# Patient Record
Sex: Female | Born: 1980 | Hispanic: Yes | Marital: Single | Smoking: Never smoker
Health system: Southern US, Community
[De-identification: ages and names within clinical notes are randomized; demographics above are authoritative.]

## PROBLEM LIST (undated history)

## (undated) HISTORY — PX: ABDOMINAL HYSTERECTOMY: SHX81

---

## 2018-11-16 ENCOUNTER — Other Ambulatory Visit: Payer: Self-pay

## 2018-11-16 ENCOUNTER — Encounter (HOSPITAL_COMMUNITY): Payer: Self-pay

## 2018-11-16 ENCOUNTER — Emergency Department (HOSPITAL_COMMUNITY): Payer: Self-pay

## 2018-11-16 ENCOUNTER — Emergency Department (HOSPITAL_COMMUNITY)
Admission: EM | Admit: 2018-11-16 | Discharge: 2018-11-16 | Disposition: A | Discharge status: Home / Self Care | Payer: Self-pay | Attending: Emergency Medicine | Admitting: Emergency Medicine

## 2018-11-16 DIAGNOSIS — N3001 Acute cystitis with hematuria: Secondary | ICD-10-CM | POA: Insufficient documentation

## 2018-11-16 LAB — URINALYSIS, ROUTINE W REFLEX MICROSCOPIC
Bilirubin Urine: NEGATIVE
Glucose, UA: NEGATIVE mg/dL
Ketones, ur: NEGATIVE mg/dL
Nitrite: NEGATIVE
Protein, ur: NEGATIVE mg/dL
Specific Gravity, Urine: 1.011 (ref 1.005–1.030)
WBC, UA: >50 WBC/hpf — ABNORMAL HIGH (ref 0–5)
pH: 7 (ref 5.0–8.0)

## 2018-11-16 LAB — CBC WITH DIFFERENTIAL/PLATELET
Abs Immature Granulocytes: 0.11 10*3/uL — ABNORMAL HIGH (ref 0.00–0.07)
Basophils Absolute: 0.1 10*3/uL (ref 0.0–0.1)
Basophils Relative: 0 %
Eosinophils Absolute: 0.1 10*3/uL (ref 0.0–0.5)
Eosinophils Relative: 1 %
HCT: 39.5 % (ref 36.0–46.0)
Hemoglobin: 12.5 g/dL (ref 12.0–15.0)
Immature Granulocytes: 1 %
Lymphocytes Relative: 11 %
Lymphs Abs: 2.2 10*3/uL (ref 0.7–4.0)
MCH: 28.2 pg (ref 26.0–34.0)
MCHC: 31.6 g/dL (ref 30.0–36.0)
MCV: 89 fL (ref 80.0–100.0)
Monocytes Absolute: 1.4 10*3/uL — ABNORMAL HIGH (ref 0.1–1.0)
Monocytes Relative: 7 %
Neutro Abs: 16.8 10*3/uL — ABNORMAL HIGH (ref 1.7–7.7)
Neutrophils Relative %: 80 %
Platelets: 413 10*3/uL — ABNORMAL HIGH (ref 150–400)
RBC: 4.44 MIL/uL (ref 3.87–5.11)
RDW: 13.7 % (ref 11.5–15.5)
WBC: 20.6 10*3/uL — ABNORMAL HIGH (ref 4.0–10.5)
nRBC: 0 % (ref 0.0–0.2)

## 2018-11-16 LAB — I-STAT BETA HCG BLOOD, ED (MC, WL, AP ONLY): I-stat hCG, quantitative: <5 m[IU]/mL (ref <5)

## 2018-11-16 LAB — COMPREHENSIVE METABOLIC PANEL
ALT: 23 U/L (ref 0–44)
AST: 25 U/L (ref 15–41)
Albumin: 4.4 g/dL (ref 3.5–5.0)
Alkaline Phosphatase: 65 U/L (ref 38–126)
Anion gap: 9 (ref 5–15)
BUN: 15 mg/dL (ref 6–20)
CO2: 26 mmol/L (ref 22–32)
Calcium: 9 mg/dL (ref 8.9–10.3)
Chloride: 102 mmol/L (ref 98–111)
Creatinine, Ser: 0.67 mg/dL (ref 0.44–1.00)
GFR calc Af Amer: >60 mL/min (ref >60)
GFR calc non Af Amer: >60 mL/min (ref >60)
Glucose, Bld: 103 mg/dL — ABNORMAL HIGH (ref 70–99)
Potassium: 4.1 mmol/L (ref 3.5–5.1)
Sodium: 137 mmol/L (ref 135–145)
Total Bilirubin: 0.2 mg/dL — ABNORMAL LOW (ref 0.3–1.2)
Total Protein: 7.7 g/dL (ref 6.5–8.1)

## 2018-11-16 LAB — LIPASE, BLOOD: Lipase: 28 U/L (ref 11–51)

## 2018-11-16 MED ORDER — SODIUM CHLORIDE 0.9 % IV BOLUS
1000.0000 mL | Freq: Once | INTRAVENOUS | Status: AC
  Administered 2018-11-16: 18:00:00 1000 mL via INTRAVENOUS

## 2018-11-16 MED ORDER — SODIUM CHLORIDE (PF) 0.9 % IJ SOLN
INTRAMUSCULAR | Status: AC
  Filled 2018-11-16: qty 50

## 2018-11-16 MED ORDER — MORPHINE SULFATE (PF) 4 MG/ML IV SOLN
4.0000 mg | Freq: Once | INTRAVENOUS | Status: DC
  Filled 2018-11-16: qty 1

## 2018-11-16 MED ORDER — IOHEXOL 300 MG/ML  SOLN
100.0000 mL | Freq: Once | INTRAMUSCULAR | Status: AC | PRN
  Administered 2018-11-16: 100 mL via INTRAVENOUS

## 2018-11-16 MED ORDER — SODIUM CHLORIDE 0.9 % IV SOLN
1.0000 g | Freq: Once | INTRAVENOUS | Status: DC
  Filled 2018-11-16: qty 10

## 2018-11-16 MED ORDER — CEPHALEXIN 500 MG PO CAPS: 500.0000 mg | ORAL_CAPSULE | Freq: Four times a day (QID) | ORAL | 0 refills | Status: AC

## 2018-11-16 MED ORDER — MORPHINE SULFATE (PF) 4 MG/ML IV SOLN
4.0000 mg | Freq: Once | INTRAVENOUS | Status: AC
  Administered 2018-11-16: 18:00:00 4 mg via INTRAVENOUS
  Filled 2018-11-16: qty 1

## 2018-11-16 MED ORDER — ONDANSETRON 4 MG PO TBDP: 4.0000 mg | ORAL_TABLET | Freq: Three times a day (TID) | ORAL | 0 refills | Status: AC | PRN

## 2018-11-16 MED ORDER — CEFTRIAXONE SODIUM 1 G IJ SOLR
1.0000 g | Freq: Once | INTRAMUSCULAR | Status: AC
  Administered 2018-11-16: 21:00:00 1 g via INTRAMUSCULAR
  Filled 2018-11-16: qty 10

## 2018-11-16 MED ORDER — ONDANSETRON HCL 4 MG/2ML IJ SOLN
4.0000 mg | Freq: Once | INTRAMUSCULAR | Status: AC
  Administered 2018-11-16: 4 mg via INTRAVENOUS
  Filled 2018-11-16: qty 2

## 2018-11-16 NOTE — ED Triage Notes (Signed)
Patient c/o lower abdominal pain and right flank pain x 2 days. Patient reports that she has dysuria, frequency and visible blood in her urine.

## 2018-11-16 NOTE — ED Notes (Signed)
The rocephin 1g in NA CL 0.9% 100 mL IVPB was discontinued while I was in the room and already activated the medication. Wasted in Chiropodist bin with Press photographer.

## 2018-11-16 NOTE — Discharge Instructions (Addendum)
You were evaluated today for urinary tract infection.  Take antibiotics as prescribed.  Follow-up with PCP for reevaluation.  If you develop fever, worsening pain seek reevaluation.

## 2018-11-16 NOTE — ED Notes (Signed)
Pt requesting prescriptions be sent to the [facility] on [facility]

## 2018-11-16 NOTE — ED Notes (Signed)
Patient transported to CT 

## 2018-11-16 NOTE — ED Notes (Signed)
Patient  Is spanish speaking and Video interpreter Achilles Dunk is

## 2018-11-16 NOTE — ED Provider Notes (Signed)
[facility]-EMERGENCY DEPT Provider Note   CSN: [id number] Arrival date & time: 11/16/18  1605  History   Chief Complaint Chief Complaint  Patient presents with   Abdominal Pain   Flank Pain   Urinary Frequency   Hematuria    HPI Charlotte Sanders is a 38 y.o. female with no past medical history who presents for evaluation of abdominal pain.  Patient states she has had lower abdominal pain x2 days.  Patient states she woke up this morning and her pain radiated to her right flank.  States she is also had dysuria, increased frequency.  States just PTA when she went to use the restroom when she wiped she noticed some blood on the toilet tissue.  Denies history of cystitis or kidney stones.  States she does have a prior history of laparoscopic abdominal surgery however does not know what this was for.  States she no longer gets her menstrual cycle.  Her pain is not worse with food intake.  She denies fever, chills, vomiting, headache, dizziness, syncope, chest pain, shortness of breath, diarrhea dysuria.  Her last bowel movement was this morning without melena or hematochezia.  States she has had persistent nausea since around 12 PM today.  She has been able to tolerate small sips of juice without issue.   Denies any medication allergies.  She does have history of hypercholesterolemia which is controlled by diet.  She does not have a PCP. Last p.o. intake 12 PM today.  Prior abdominal surgeries??  History obtained from patient and review of prior medical record.  Medical Spanish interpreter was used.     HPI  History reviewed. No pertinent past medical history.  There are no active problems to display for this patient.   Past Surgical History:  Procedure Laterality Date   ABDOMINAL HYSTERECTOMY       OB History   No obstetric history on file.      Home Medications    Prior to Admission medications   Medication Sig Start Date End Date Taking?  Authorizing Provider  ibuprofen (ADVIL) 200 MG tablet Take 400 mg by mouth every 6 (six) hours as needed for mild pain.   Yes [provider]  cephALEXin (KEFLEX) 500 MG capsule Take 1 capsule (500 mg total) by mouth 4 (four) times daily for 5 days. 11/16/18 11/21/18  Daleen Steinhaus A, PA-C  ondansetron (ZOFRAN ODT) 4 MG disintegrating tablet Take 1 tablet (4 mg total) by mouth every 8 (eight) hours as needed for nausea or vomiting. 11/16/18   Amberlin Utke A, PA-C    Family History Family History  Problem Relation Age of Onset   Hypertension Mother    Diabetes Mother    Cancer Father     Social History Social History   Tobacco Use   Smoking status: Never Smoker   Smokeless tobacco: Never Used  Substance Use Topics   Alcohol use: Yes   Drug use: Never     Allergies   Patient has no known allergies.   Review of Systems Review of Systems  Constitutional: Negative.   HENT: Negative.   Eyes: Negative.   Respiratory: Negative.   Cardiovascular: Negative.   Gastrointestinal: Positive for abdominal pain and nausea. Negative for abdominal distention, anal bleeding, blood in stool, constipation, diarrhea, rectal pain and vomiting.  Genitourinary: Positive for dysuria, flank pain, frequency and hematuria. Negative for decreased urine volume, difficulty urinating, genital sores, menstrual problem, pelvic pain, urgency, vaginal bleeding, vaginal discharge and vaginal  pain.  Skin: Negative.   Neurological: Negative.   All other systems reviewed and are negative.    Physical Exam Updated Vital Signs BP (!) 101/57    Pulse 82    Temp 98.3 F (36.8 C) (Oral)    Resp 15    Ht 5\' 5"  (1.651 m)    Wt 60.8 kg    LMP 11/02/2018 Comment: neg hcg   SpO2 98%    BMI 22.30 kg/m   Physical Exam Vitals signs and nursing note reviewed.  Constitutional:      General: She is not in acute distress.    Appearance: She is well-developed. She is not ill-appearing, toxic-appearing  or diaphoretic.  HENT:     Head: Normocephalic and atraumatic.     Mouth/Throat:     Mouth: Mucous membranes are moist.     Pharynx: Oropharynx is clear.     Comments: Mucous membranes moist. Posterior oropharynx clear. Uvula midline. Eyes:     Pupils: Pupils are equal, round, and reactive to light.  Neck:     Musculoskeletal: Normal range of motion.  Cardiovascular:     Rate and Rhythm: Normal rate.     Heart sounds: Normal heart sounds. No murmur. No friction rub. No gallop.   Pulmonary:     Effort: No respiratory distress.     Comments: Clear to auscultation bilaterally without wheeze, rhonchi or rales.  Speaks in full sentences no difficulty. No assessory muscle usage. Abdominal:     General: Bowel sounds are normal. There is no distension.     Palpations: Abdomen is soft.     Tenderness: There is no right CVA tenderness, left CVA tenderness, guarding or rebound.     Hernia: No hernia is present.     Comments: Soft without rebound or guarding.  Tenderness to palpation to suprapubic region.  Negative CVA tap bilaterally.  Negative left lower quadrant or right lower quadrant focal tenderness.  Negative psoas obturator sign.  Negative McBurney point.  Musculoskeletal: Normal range of motion.     Comments: Moves all 4 extremities without difficulty.  No lower extremity with, erythema, ecchymosis or warmth.  Skin:    General: Skin is warm and dry.     Comments: Brisk capillary refill.  No pallor.  No rashes or lesions.  Neurological:     Mental Status: She is alert.     Comments: Cranial nerves III to XII grossly intact.  No facial droop.  Ambulatory in ED without difficulty.      ED Treatments / Results  Labs (all labs ordered are listed, but only abnormal results are displayed) Labs Reviewed  URINALYSIS, ROUTINE W REFLEX MICROSCOPIC - Abnormal; Notable for the following components:      Result Value   Color, Urine STRAW (*)    APPearance HAZY (*)    Hgb urine dipstick  MODERATE (*)    Leukocytes,Ua LARGE (*)    WBC, UA >50 (*)    Bacteria, UA RARE (*)    All other components within normal limits  CBC WITH DIFFERENTIAL/PLATELET - Abnormal; Notable for the following components:   WBC 20.6 (*)    Platelets 413 (*)    Neutro Abs 16.8 (*)    Monocytes Absolute 1.4 (*)    Abs Immature Granulocytes 0.11 (*)    All other components within normal limits  COMPREHENSIVE METABOLIC PANEL - Abnormal; Notable for the following components:   Glucose, Bld 103 (*)    Total Bilirubin 0.2 (*)  All other components within normal limits  URINE CULTURE  LIPASE, BLOOD  I-STAT BETA HCG BLOOD, ED ([facility], [facility], [facility] ONLY)    EKG None  Radiology Ct Abdomen Pelvis W Contrast  Result Date: 11/16/2018 CLINICAL DATA:  Lower abdominal and right flank pain for 2 days. Dysuria and frequency with hematuria. EXAM: CT ABDOMEN AND PELVIS WITH CONTRAST TECHNIQUE: Multidetector CT imaging of the abdomen and pelvis was performed using the standard protocol following bolus administration of intravenous contrast. CONTRAST:  100mL OMNIPAQUE IOHEXOL 300 MG/ML  SOLN COMPARISON:  None. FINDINGS: Lower chest:  No contributory findings. Hepatobiliary: No focal liver abnormality.No evidence of biliary obstruction or stone. Pancreas: Unremarkable. Spleen: Unremarkable. Adrenals/Urinary Tract: Negative adrenals. 2 mm right renal calculus. No hydronephrosis or ureteral stone. No detectable pyelonephritis. Unremarkable bladder. Stomach/Bowel: No obstruction. Small volume fluid present about the tip of the appendix which is not thickened or otherwise inflamed appearing. Vascular/Lymphatic: No acute vascular abnormality. No mass or adenopathy. Reproductive:No pathologic findings. Other: Trace peritoneal fluid which may be physiologic. Musculoskeletal: No acute abnormalities. IMPRESSION: 1. History of pyelonephritis which is not detectable by CT. No hydronephrosis or abscess. 2. 2 mm right renal calculus.  Electronically Signed   By: Marnee SpringJonathon  Watts M.D.   On: 11/16/2018 19:04    Procedures Procedures (including critical care time)  Medications Ordered in ED Medications  sodium chloride (PF) 0.9 % injection (0 mLs  Hold 11/16/18 1845)  morphine 4 MG/ML injection 4 mg (4 mg Intravenous Refused 11/16/18 2058)  sodium chloride 0.9 % bolus 1,000 mL (0 mLs Intravenous Stopped 11/16/18 1845)  ondansetron (ZOFRAN) injection 4 mg (4 mg Intravenous Given 11/16/18 1731)  morphine 4 MG/ML injection 4 mg (4 mg Intravenous Given 11/16/18 1732)  iohexol (OMNIPAQUE) 300 MG/ML solution 100 mL (100 mLs Intravenous Contrast Given 11/16/18 1848)  cefTRIAXone (ROCEPHIN) injection 1 g (1 g Intramuscular Given 11/16/18 2046)    Initial Impression / Assessment and Plan / ED Course  I have reviewed the triage vital signs and the nursing notes.  Pertinent labs & imaging results that were available during my care of the patient were reviewed by me and considered in my medical decision making (see chart for details).  38 year old female appears otherwise well presents for evaluation of abdominal pain.  Afebrile, nonseptic, non-ill-appearing.  Patient with urinary frequency, dysuria as well as right-sided flank pain.  She denies any history of UTI or known stone disease.  She is able to tolerate p.o. intake at home.  Her abdomen is soft without rebound or guarding.  She has mild suprapubic tenderness palpation.  She has no focal right lower quadrant tenderness palpation.  Negative CVA tap bilaterally.  Negative psoas obturator sign.  States she does have prior abdominal surgical history however she cannot recall which she had previously done.  [place]  I do not see records of this in epic.  No abdominal wall skin changes.  No obvious herniations.  Mucous membranes moist.  Heart and lungs clear.  Last bowel movement this morning without melena or hematochezia.  No known COVID exposures.  Will plan for  labs, urine, CT imaging to assess for pyelonephritis, stone, abscess and reevaluate.  1830: Pain well controlled. Pending CT AP.  Labs and imaging personally reviewed: CBC: With leukocytosis at 20.6, hemoglobin at 12.5 CMP: without electrolyte, renal or liver abnormalities Lipase: 28 Hcg: Negative Urinalysis: Consistent with infection Urine culture: Pending CT AP: 2mm renal stone. No ureteral stone or pyelonephritis.  No acute AP pathology. Low suspicion for stone as cause of pain as no ureteral stone or infected stone.  On reevaluation patient comfortable. Patient given Rocephin in ED.  Pain controlled.  She is no episodes of emesis. Question early pyelonephritis given positive urinalysis with flank pain? VS stable without tachycardia, tachypnea or hypoxia.  Tolerating p.o. intake in ED without difficulty.  She has not any melena, hematochezia or hematemesis.  No evidence of GI bleed.  Patient is nontoxic, nonseptic appearing, in no apparent distress.  Patient's pain and other symptoms adequately managed in emergency department.  Fluid bolus given.  Labs, imaging and vitals reviewed.  Patient does not meet the SIRS or Sepsis criteria.  On repeat exam patient does not have a surgical abdomin and there are no peritoneal signs.  No indication of appendicitis, bowel obstruction, bowel perforation, cholecystitis, diverticulitis, PID or ectopic pregnancy, mesenteric ischemia, ovarian torsion, strangulated hernia.  The patient has been appropriately medically screened and/or stabilized in the ED. I have low suspicion for any other emergent medical condition which would require further screening, evaluation or treatment in the ED or require inpatient management.  Patient is hemodynamically stable and in no acute distress.  Patient able to ambulate in department prior to ED.  Evaluation does not show acute pathology that would require ongoing or additional emergent interventions while in the emergency  department or further inpatient treatment.  I have discussed the diagnosis with the patient and answered all questions.  Pain is been managed while in the emergency department and patient has no further complaints prior to discharge. I have discussed strict return precautions for returning to the emergency department.  Patient was encouraged to follow-up with PCP/specialist refer to at discharge.      Final Clinical Impressions(s) / ED Diagnoses   Final diagnoses:  Acute cystitis with hematuria    ED Discharge Orders         Ordered    cephALEXin (KEFLEX) 500 MG capsule  4 times daily     11/16/18 2000    ondansetron (ZOFRAN ODT) 4 MG disintegrating tablet  Every 8 hours PRN     11/16/18 2000           Catricia Scheerer A, PA-C 11/16/18 2137    Melene Plan, DO 11/16/18 2217

## 2018-11-18 LAB — URINE CULTURE: Culture: >=100000 — AB

## 2018-11-19 ENCOUNTER — Telehealth: Payer: Self-pay | Admitting: *Deleted

## 2018-11-19 NOTE — Telephone Encounter (Signed)
Post ED Visit - Positive Culture Follow-up  Culture report reviewed by antimicrobial stewardship pharmacist: [facility] []  Enzo Bi, Pharm.D. []  Celedonio Miyamoto, Pharm.D., BCPS AQ-ID []  Garvin Fila, Pharm.D., BCPS []  Georgina Pillion, [address].D., BCPS []  Bud, [address].D., BCPS, AAHIVP []  Estella Husk, Pharm.D., BCPS, AAHIVP []  Lysle Pearl, PharmD, BCPS []  Phillips Climes, PharmD, BCPS []  Agapito Games, PharmD, BCPS []  Verlan Friends, PharmD []  Mervyn Gay, PharmD, BCPS []  Vinnie Level, PharmD  [facility] []  Len Childs, PharmD []  Greer Pickerel, PharmD []  Adalberto Cole, PharmD []  Perlie Gold, Rph []  Lonell Face) Jean Rosenthal, PharmD []  Earl Many, PharmD []  Junita Push, PharmD []  Dorna Leitz, PharmD []  Terrilee Files, PharmD []  Lynann Beaver, PharmD [x]  Keturah Barre, PharmD []  Loralee Pacas, PharmD []  Bernadene Person, PharmD   Positive urine culture Treated with Cephalexin, organism sensitive to the same and no further patient follow-up is required at this time.  [facility] 11/19/2018, 11:03 AM

## 2020-06-11 IMAGING — CT CT ABDOMEN AND PELVIS WITH CONTRAST
2 of 4 series · 16 of 46 positions shown, 18 images · IV contrast (omnipaque)
Comparison: None.

CLINICAL DATA: Lower abdominal and right flank pain for 2 days.
Dysuria and frequency with hematuria.

EXAM:
CT ABDOMEN AND PELVIS WITH CONTRAST
TECHNIQUE: Multidetector CT imaging of the abdomen and pelvis was performed
using the standard protocol following bolus administration of
intravenous contrast.
CONTRAST:  100mL OMNIPAQUE IOHEXOL 300 MG/ML  SOLN

[Series 2: axial st · axial · 0.68mm/px · z∈[-438,-18]mm · 13 of 94 slices shown, 15 images]
[im 5/94  soft-tissue]
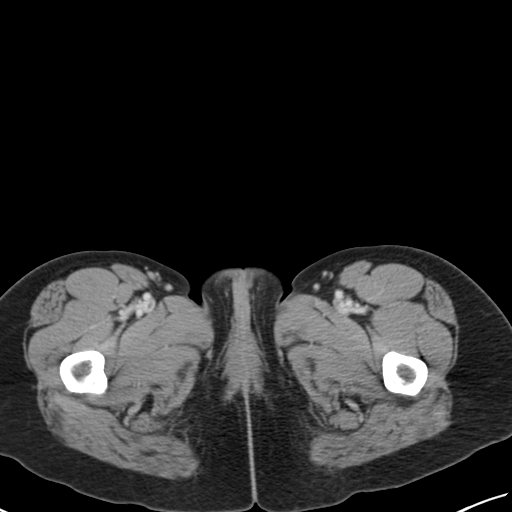
[im 5/94  bone]
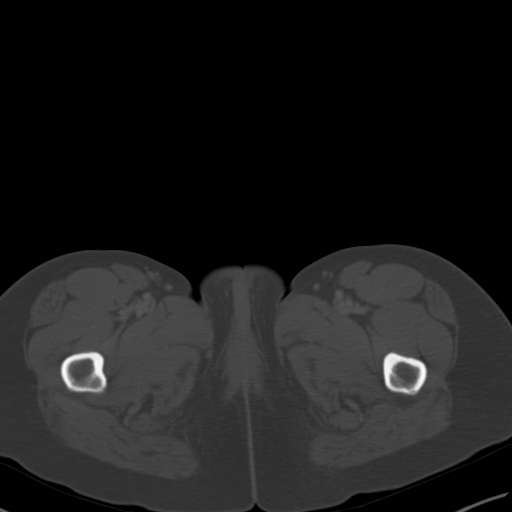
[im 14/94  soft-tissue]
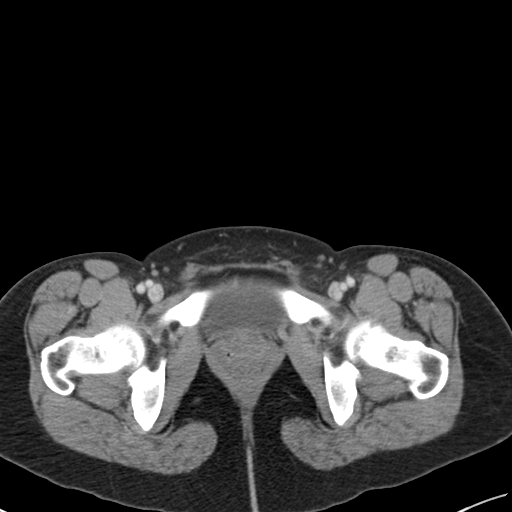
[im 19/94  soft-tissue]
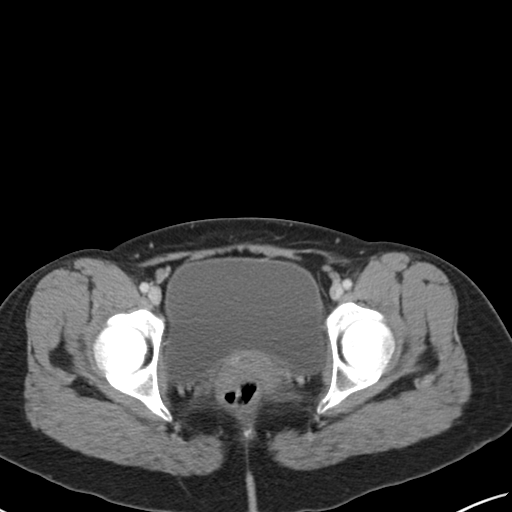
[im 28/94  soft-tissue]
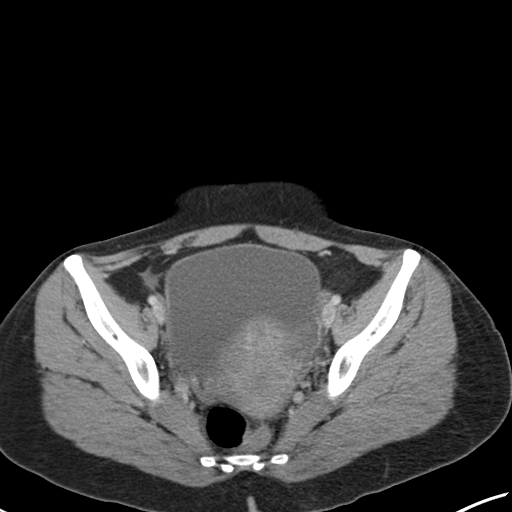
[im 33/94  soft-tissue]
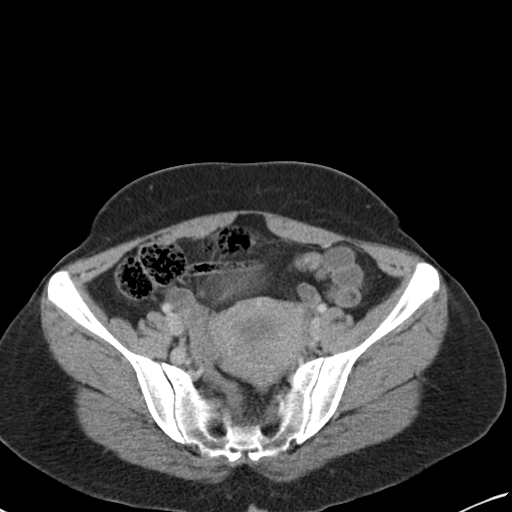
[im 42/94  soft-tissue]
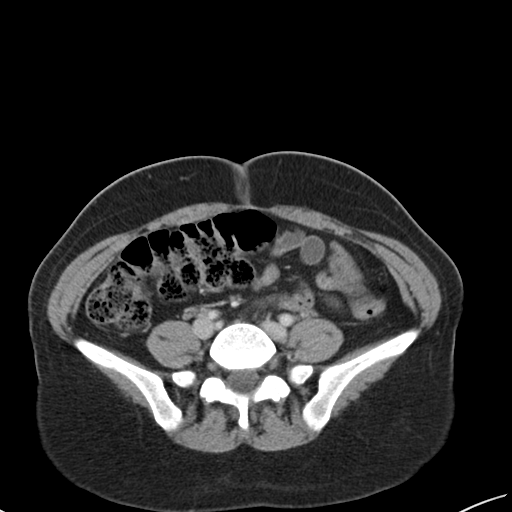
[im 47/94  soft-tissue]
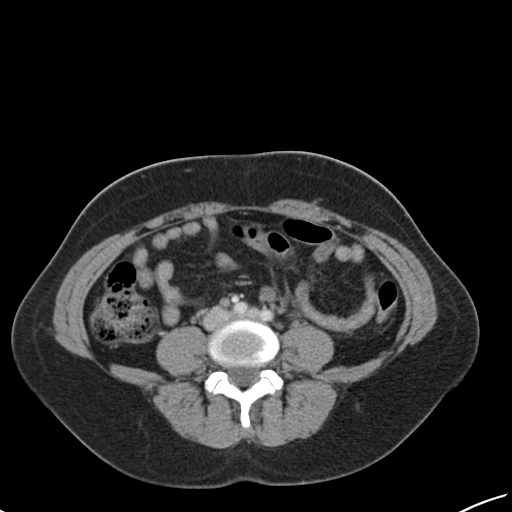
[im 52/94  soft-tissue]
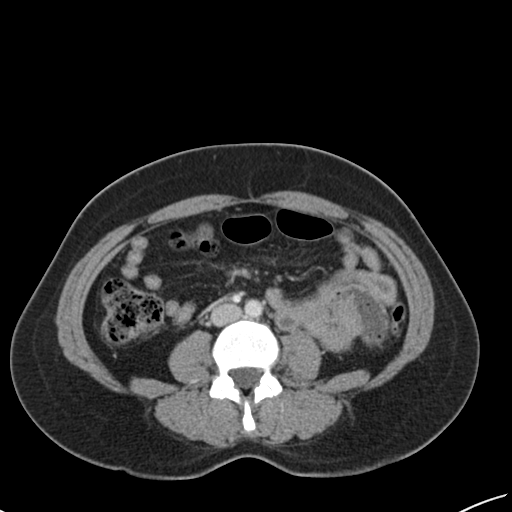
[im 61/94  soft-tissue]
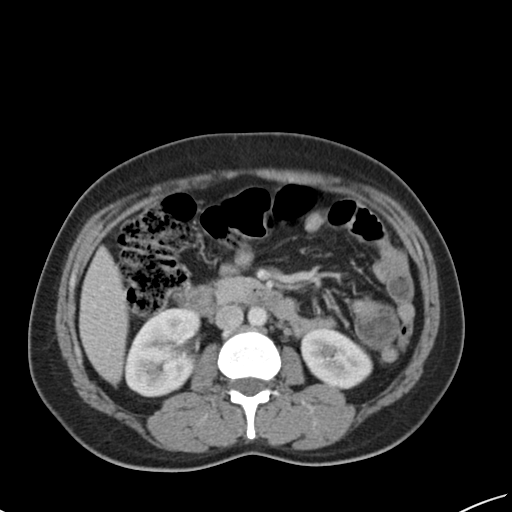
[im 61/94  bone]
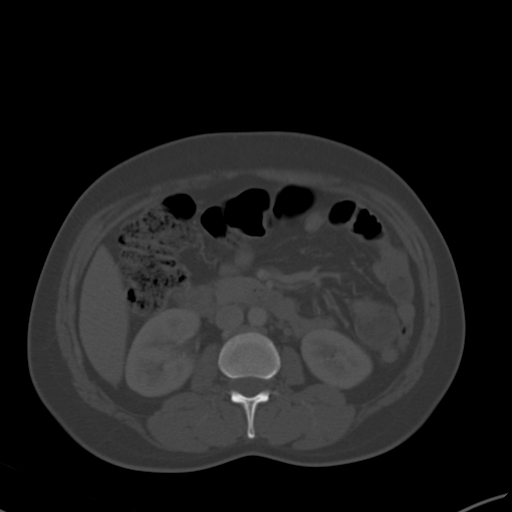
[im 66/94  soft-tissue]
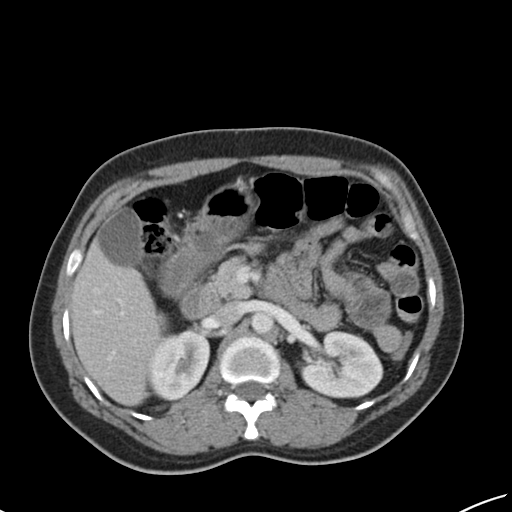
[im 75/94  soft-tissue]
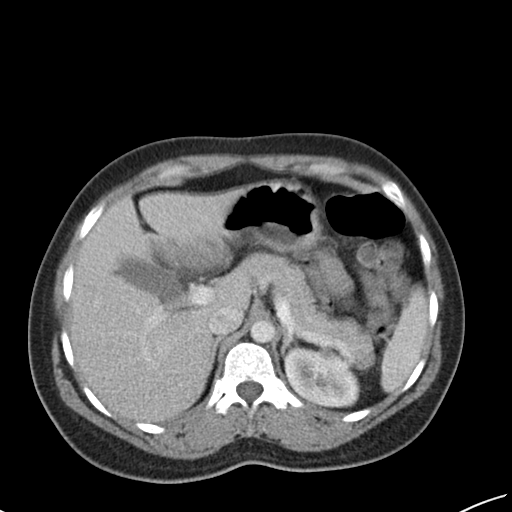
[im 80/94  soft-tissue]
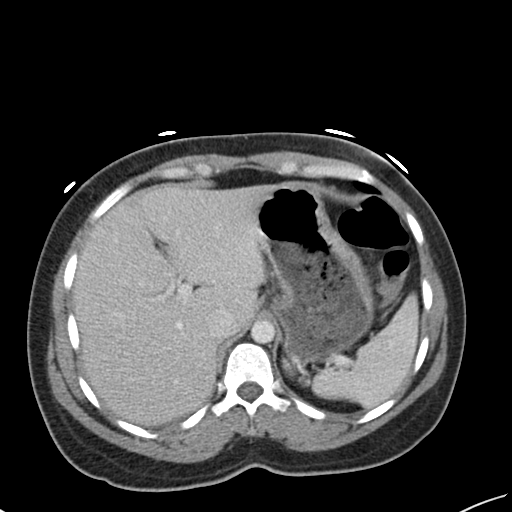
[im 89/94  soft-tissue]
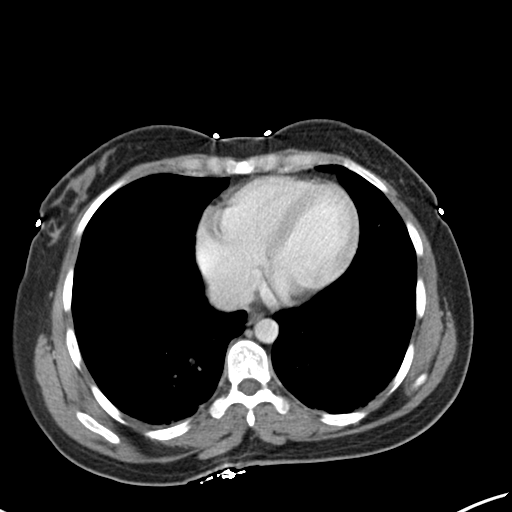

[Series 6: coronal st · coronal · 0.79mm/px · 3 of 129 slices shown]
[im 43/129  soft-tissue]
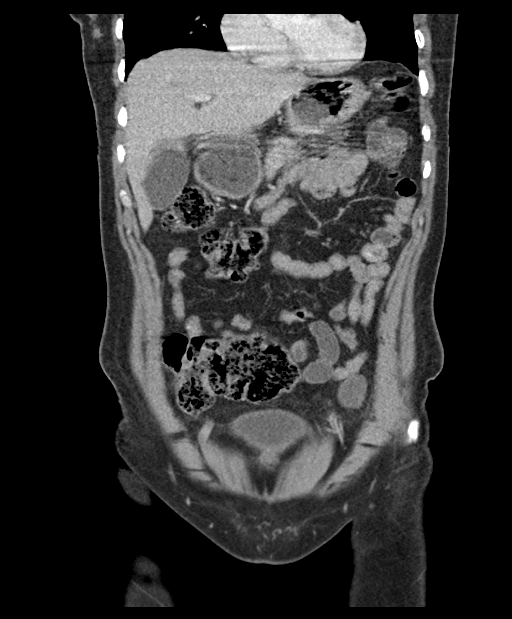
[im 57/129  soft-tissue]
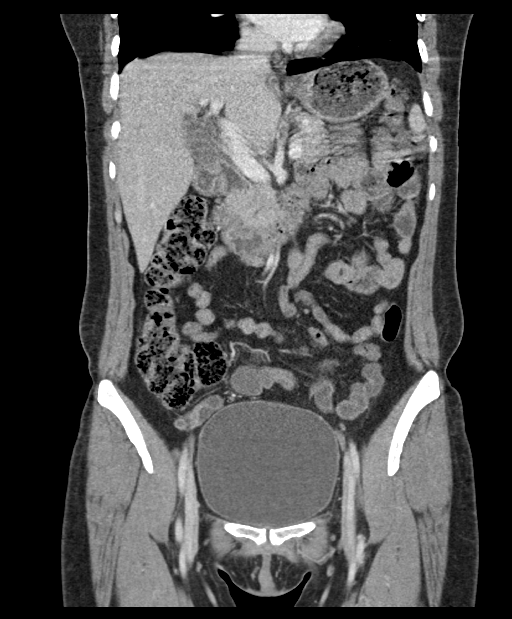
[im 72/129  soft-tissue]
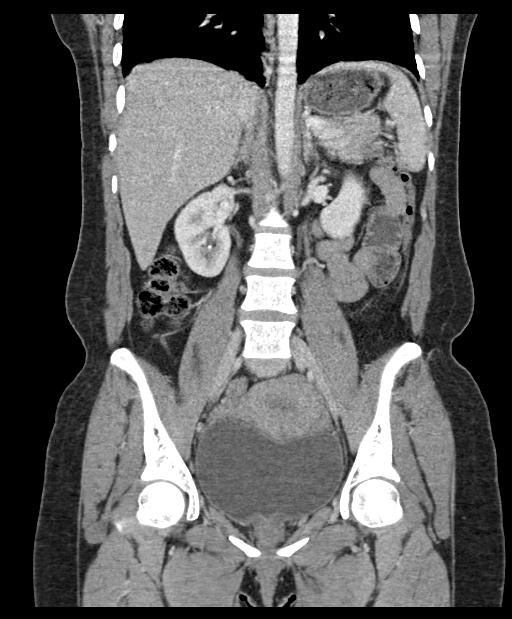

[16 of 46 positions shown; findings below may reference images not displayed]

FINDINGS: Lower chest:  No contributory findings.

Hepatobiliary: No focal liver abnormality.No evidence of biliary
obstruction or stone.

Pancreas: Unremarkable.

Spleen: Unremarkable.

Adrenals/Urinary Tract: Negative adrenals. 2 mm right renal
calculus. No hydronephrosis or ureteral stone. No detectable
pyelonephritis. Unremarkable bladder.

Stomach/Bowel: No obstruction. Small volume fluid present about the
tip of the appendix which is not thickened or otherwise inflamed
appearing.

Vascular/Lymphatic: No acute vascular abnormality. No mass or
adenopathy.

Reproductive:No pathologic findings.

Other: Trace peritoneal fluid which may be physiologic.

Musculoskeletal: No acute abnormalities.
IMPRESSION: 1. History of pyelonephritis which is not detectable by CT. No
hydronephrosis or abscess.
2. 2 mm right renal calculus.
